# Patient Record
Sex: Male | Born: 1990 | Hispanic: Yes | Marital: Single | Smoking: Never smoker
Health system: Southern US, Community
[De-identification: ages and names within clinical notes are randomized; demographics above are authoritative.]

## PROBLEM LIST (undated history)

## (undated) DIAGNOSIS — J45909 Unspecified asthma, uncomplicated: Secondary | ICD-10-CM

---

## 2021-08-15 ENCOUNTER — Emergency Department (HOSPITAL_COMMUNITY): Payer: Self-pay

## 2021-08-15 ENCOUNTER — Emergency Department (HOSPITAL_COMMUNITY)
Admission: EM | Admit: 2021-08-15 | Discharge: 2021-08-15 | Disposition: A | Discharge status: Home / Self Care | Payer: Self-pay | Attending: Emergency Medicine | Admitting: Emergency Medicine

## 2021-08-15 ENCOUNTER — Encounter (HOSPITAL_COMMUNITY): Payer: Self-pay | Admitting: Emergency Medicine

## 2021-08-15 ENCOUNTER — Other Ambulatory Visit: Payer: Self-pay

## 2021-08-15 DIAGNOSIS — J039 Acute tonsillitis, unspecified: Secondary | ICD-10-CM | POA: Insufficient documentation

## 2021-08-15 DIAGNOSIS — J020 Streptococcal pharyngitis: Secondary | ICD-10-CM

## 2021-08-15 DIAGNOSIS — R000 Tachycardia, unspecified: Secondary | ICD-10-CM | POA: Insufficient documentation

## 2021-08-15 DIAGNOSIS — J45909 Unspecified asthma, uncomplicated: Secondary | ICD-10-CM | POA: Insufficient documentation

## 2021-08-15 DIAGNOSIS — D72829 Elevated white blood cell count, unspecified: Secondary | ICD-10-CM | POA: Insufficient documentation

## 2021-08-15 HISTORY — DX: Unspecified asthma, uncomplicated: J45.909

## 2021-08-15 LAB — CBC WITH DIFFERENTIAL/PLATELET
Abs Immature Granulocytes: 0.03 10*3/uL (ref 0.00–0.07)
Basophils Absolute: 0 10*3/uL (ref 0.0–0.1)
Basophils Relative: 0 %
Eosinophils Absolute: 0 10*3/uL (ref 0.0–0.5)
Eosinophils Relative: 0 %
HCT: 43.2 % (ref 39.0–52.0)
Hemoglobin: 15.2 g/dL (ref 13.0–17.0)
Immature Granulocytes: 0 %
Lymphocytes Relative: 21 %
Lymphs Abs: 2.3 10*3/uL (ref 0.7–4.0)
MCH: 31.2 pg (ref 26.0–34.0)
MCHC: 35.2 g/dL (ref 30.0–36.0)
MCV: 88.7 fL (ref 80.0–100.0)
Monocytes Absolute: 1.1 10*3/uL — ABNORMAL HIGH (ref 0.1–1.0)
Monocytes Relative: 10 %
Neutro Abs: 7.5 10*3/uL (ref 1.7–7.7)
Neutrophils Relative %: 69 %
Platelets: 242 10*3/uL (ref 150–400)
RBC: 4.87 MIL/uL (ref 4.22–5.81)
RDW: 12.4 % (ref 11.5–15.5)
WBC: 11 10*3/uL — ABNORMAL HIGH (ref 4.0–10.5)
nRBC: 0 % (ref 0.0–0.2)

## 2021-08-15 LAB — COMPREHENSIVE METABOLIC PANEL
ALT: 35 U/L (ref 0–44)
AST: 22 U/L (ref 15–41)
Albumin: 4.3 g/dL (ref 3.5–5.0)
Alkaline Phosphatase: 76 U/L (ref 38–126)
Anion gap: 8 (ref 5–15)
BUN: 10 mg/dL (ref 6–20)
CO2: 25 mmol/L (ref 22–32)
Calcium: 9.2 mg/dL (ref 8.9–10.3)
Chloride: 104 mmol/L (ref 98–111)
Creatinine, Ser: 0.7 mg/dL (ref 0.61–1.24)
GFR, Estimated: >60 mL/min (ref >60)
Glucose, Bld: 143 mg/dL — ABNORMAL HIGH (ref 70–99)
Potassium: 3.6 mmol/L (ref 3.5–5.1)
Sodium: 137 mmol/L (ref 135–145)
Total Bilirubin: 1.5 mg/dL — ABNORMAL HIGH (ref 0.3–1.2)
Total Protein: 7.5 g/dL (ref 6.5–8.1)

## 2021-08-15 LAB — GROUP A STREP BY PCR: Group A Strep by PCR: DETECTED — AB

## 2021-08-15 LAB — LACTIC ACID, PLASMA: Lactic Acid, Venous: 1 mmol/L (ref 0.5–1.9)

## 2021-08-15 MED ORDER — IOHEXOL 300 MG/ML  SOLN
50.0000 mL | Freq: Once | INTRAMUSCULAR | Status: AC | PRN
  Administered 2021-08-15: 50 mL via INTRAVENOUS

## 2021-08-15 MED ORDER — OXYCODONE-ACETAMINOPHEN 5-325 MG PO TABS: 1.0000 | ORAL_TABLET | ORAL | 0 refills | Status: AC | PRN

## 2021-08-15 MED ORDER — DEXAMETHASONE SODIUM PHOSPHATE 10 MG/ML IJ SOLN
10.0000 mg | Freq: Once | INTRAMUSCULAR | Status: AC
  Administered 2021-08-15: 10 mg via INTRAVENOUS
  Filled 2021-08-15: qty 1

## 2021-08-15 MED ORDER — MORPHINE SULFATE (PF) 4 MG/ML IV SOLN
4.0000 mg | Freq: Once | INTRAVENOUS | Status: AC
  Administered 2021-08-15: 4 mg via INTRAVENOUS
  Filled 2021-08-15: qty 1

## 2021-08-15 MED ORDER — CLINDAMYCIN HCL 150 MG PO CAPS: 450.0000 mg | ORAL_CAPSULE | Freq: Three times a day (TID) | ORAL | 0 refills | Status: AC

## 2021-08-15 NOTE — ED Notes (Signed)
Reviewed discharge instructions with patient. Follow-up care and medications reviewed. Patient  verbalized understanding. Patient A&Ox4, VSS, and ambulatory with steady gait upon discharge.  °

## 2021-08-15 NOTE — ED Notes (Signed)
Pt provided w/ [place] sandwich and gingerale for PO challenge per Tegeler MD

## 2021-08-15 NOTE — Discharge Instructions (Signed)
Your history, exam and work-up today revealed evidence of strep throat causing tonsillitis and the swelling and pain.  There is no evidence of abscess on the CT imaging.  As you were able to eat and drink and your vital signs were reassuring, we feel you are safe for discharge home.  Please the pain medicine help with discomfort and take the antibiotic for the next 2 weeks.  Please follow-up with the ENT if symptoms persist.  If any symptoms change or worsen acutely including having difficulty breathing, please return to the nearest emergency department.

## 2021-08-15 NOTE — ED Provider Notes (Signed)
[facility] EMERGENCY DEPARTMENT Provider Note   CSN: [id number] Arrival date & time: 08/15/21  4098     History  No chief complaint on file.   Peter Williamson is a 31 y.o. male.  The history is provided by the patient and medical records. No language interpreter was used.  Sore Throat This is a new problem. The current episode started 2 days ago. The problem occurs constantly. The problem has been gradually worsening. Pertinent negatives include no chest pain, no abdominal pain, no headaches and no shortness of breath (with certain positions has difficulty breathing). Nothing aggravates the symptoms. Nothing relieves the symptoms. He has tried nothing for the symptoms. The treatment provided no relief.       Home Medications Prior to Admission medications   Not on File      Allergies    Patient has no known allergies.    Review of Systems   Review of Systems  Constitutional:  Positive for chills and fever. Negative for diaphoresis and fatigue.  HENT:  Positive for sore throat and trouble swallowing. Negative for congestion and voice change.   Eyes:  Negative for visual disturbance.  Respiratory:  Negative for cough, chest tightness, shortness of breath (with certain positions has difficulty breathing) and wheezing.   Cardiovascular:  Negative for chest pain.  Gastrointestinal:  Negative for abdominal pain, constipation, diarrhea, nausea and vomiting.  Genitourinary:  Negative for dysuria and flank pain.  Musculoskeletal:  Negative for back pain, neck pain and neck stiffness.  Skin:  Negative for rash and wound.  Neurological:  Negative for weakness, light-headedness, numbness and headaches.  Psychiatric/Behavioral:  Negative for agitation and confusion.   All other systems reviewed and are negative.   Physical Exam Updated Vital Signs BP (!) 161/99 (BP Location: Right Arm)   Pulse (!) 109   Temp 100 F (37.8 C) (Oral)   Resp 18    SpO2 95%  Physical Exam Vitals and nursing note reviewed.  Constitutional:      General: He is not in acute distress.    Appearance: He is well-developed. He is not ill-appearing, toxic-appearing or diaphoretic.  HENT:     Head: Normocephalic and atraumatic.     Nose: No congestion or rhinorrhea.     Mouth/Throat:     Mouth: Mucous membranes are moist.     Pharynx: Oropharyngeal exudate and posterior oropharyngeal erythema present.     Tonsils: Tonsillar exudate present.     Comments: Uvula midline but there is some more prominence of the right tonsil compared to left.  There is some erythema and some subtle exudates.  Normal neck range of motion but pain with turning to the right.  No posterior neck tenderness.  No crepitance.  No stridor. Eyes:     Extraocular Movements: Extraocular movements intact.     Conjunctiva/sclera: Conjunctivae normal.     Pupils: Pupils are equal, round, and reactive to light.  Neck:     Vascular: No carotid bruit.   Cardiovascular:     Rate and Rhythm: Regular rhythm. Tachycardia present.     Pulses: Normal pulses.     Heart sounds: No murmur heard. Pulmonary:     Effort: Pulmonary effort is normal. No respiratory distress.     Breath sounds: Normal breath sounds. No wheezing, rhonchi or rales.  Chest:     Chest wall: No tenderness.  Abdominal:     General: Abdomen is flat.     Palpations: Abdomen is  soft.     Tenderness: There is no abdominal tenderness. There is no right CVA tenderness, left CVA tenderness, guarding or rebound.  Musculoskeletal:        General: Tenderness present. No swelling.     Cervical back: Neck supple. Tenderness present. No rigidity. No spinous process tenderness or muscular tenderness.     Right lower leg: No edema.     Left lower leg: No edema.  Skin:    General: Skin is warm and dry.     Capillary Refill: Capillary refill takes less than 2 seconds.     Findings: No rash.  Neurological:     General: No focal  deficit present.     Mental Status: He is alert.  Psychiatric:        Mood and Affect: Mood normal.     ED Results / Procedures / Treatments   Labs (all labs ordered are listed, but only abnormal results are displayed) Labs Reviewed  GROUP A STREP BY PCR - Abnormal; Notable for the following components:      Result Value   Group A Strep by PCR DETECTED (*)    All other components within normal limits  CBC WITH DIFFERENTIAL/PLATELET - Abnormal; Notable for the following components:   WBC 11.0 (*)    Monocytes Absolute 1.1 (*)    All other components within normal limits  COMPREHENSIVE METABOLIC PANEL - Abnormal; Notable for the following components:   Glucose, Bld 143 (*)    Total Bilirubin 1.5 (*)    All other components within normal limits  CULTURE, BLOOD (ROUTINE X 2)  CULTURE, BLOOD (ROUTINE X 2)  LACTIC ACID, PLASMA    EKG None  Radiology CT Soft Tissue Neck W Contrast  Result Date: 08/15/2021 CLINICAL DATA:  Epiglottitis or tonsillitis suspected Severe right-sided neck pain and sensation of swallowing. Strep positive. Rule out peritonsillar or tonsillar abscess. Patient worse difficulty breathing when he lays flat. EXAM: CT NECK WITH CONTRAST TECHNIQUE: Multidetector CT imaging of the neck was performed using the standard protocol following the bolus administration of intravenous contrast. RADIATION DOSE REDUCTION: This exam was performed according to the departmental dose-optimization program which includes automated exposure control, adjustment of the mA and/or kV according to patient size and/or use of iterative reconstruction technique. CONTRAST:  50mL OMNIPAQUE IOHEXOL 300 MG/ML  SOLN COMPARISON:  None Available. FINDINGS: Pharynx and larynx: Mild asymmetric edema and thickening of the right palatine tonsil. No discrete, drainable fluid collection. Salivary glands: No inflammation, mass, or stone. Thyroid: Normal. Lymph nodes: Borderline enlarged upper right cervical chain  lymph nodes. Vascular: Limited assessment due to non arterial timing. Limited intracranial: Negative. Visualized orbits: Negative. Mastoids and visualized paranasal sinuses: Clear. Skeleton: No acute or aggressive process. Upper chest: Visualized lung apices are clear. IMPRESSION: 1. Mild asymmetric edema and thickening of the right palatine tonsil, suspicious for tonsillitis given the clinical history. No evidence of abscess. 2. Borderline enlarged upper right cervical chain lymph nodes, nonspecific but potentially reactive given the above findings. Electronically Signed   By: Feliberto HartsFrederick S Jones M.D.   On: 08/15/2021 11:35    Procedures Procedures    Medications Ordered in ED Medications  dexamethasone (DECADRON) injection 10 mg (10 mg Intravenous Given 08/15/21 0910)  morphine (PF) 4 MG/ML injection 4 mg (4 mg Intravenous Given 08/15/21 0910)  iohexol (OMNIPAQUE) 300 MG/ML solution 50 mL (50 mLs Intravenous Contrast Given 08/15/21 1125)    ED Course/ Medical Decision Making/ A&P  Medical Decision Making Amount and/or Complexity of Data Reviewed Labs: ordered. Radiology: ordered.  Risk Prescription drug management.    Peter Williamson is a 31 y.o. male with a past medical history significant for asthma who presents with sore throat, fevers, chills, and positional difficulty breathing.  According to patient, he has had sore throat for the last 2 or 3 days.  He reports fevers and chills and was found to be near febrile with a temp of 100.0 orally on arrival.  He says that he has not had any congestion, cough, chest pain, or constant shortness of breath.  He has difficulty swallowing and reports he has not eaten food in the last 2 days.  He does report he had some fluids that he can swallow with pain.  He is still tolerating secretions but he says when he lays flat or tilts his head forward he has difficulty breathing.  He feels that there is swelling inside his  throat on the right side which is where he has the most pain.  It is moderate to severe pain.  He has never had this pain like this before but thinks he has had strep throat in the past.  Denies trauma.  Denies any rashes.  Denies any nausea, vomiting, constipation, diarrhea, or urinary changes.  Denies any significant headache or neck stiffness.  He does report pain when he moves his head twisting to the right.  On exam, lungs clear and chest nontender.  Abdomen nontender.  He is slightly tachypneic and tachycardic but was not completely febrile.  On exam he does have tenderness on his right submandibular and neck area.  No crepitance.  No skin changes.  He does have some erythema and some tonsillar exudates on exam and the right tonsil does appear more full than the left side.  Uvula however is midline.  No stridor appreciated.  Rest of exam unremarkable.  Clinically I am concerned about peritonsillar abscess given the patient's history, description, and exam.  Differential includes other concerning findings such as a deeper space neck infection, Ludewig's angina, tonsillitis, or retropharyngeal abscess however PTA is my biggest concern versus regular tonsillitis.  We will get CT imaging and labs and give him some pain medicine and some Decadron.  Anticipate reassessment after labs and CT scan to determine disposition.       12:41 PM CT scan returned showing evidence of tonsillitis with some swelling but no evidence of acute abscess.  This is consistent with the exam.  Labs did show mild leukocytosis but were otherwise reassuring.  Patient reports the Decadron and pain medicine has helped and he is now swallowing better.  We will have him passed a p.o. challenge and if he does so, we will give him prescription for antibiotics, pain medicine, and let him be discharged to follow-up with outpatient ENT if needed.  Anticipate discharge shortly.         Final Clinical Impression(s) / ED  Diagnoses Final diagnoses:  Tonsillitis  Acute streptococcal pharyngitis     Clinical Impression: 1. Tonsillitis   2. Acute streptococcal pharyngitis     Disposition: Discharge  Condition: Good  I have discussed the results, Dx and Tx plan with the pt(& family if present). He/she/they expressed understanding and agree(s) with the plan. Discharge instructions discussed at great length. Strict return precautions discussed and pt &/or family have verbalized understanding of the instructions. No further questions at time of discharge.    New Prescriptions   CLINDAMYCIN (  CLEOCIN) 150 MG CAPSULE    Take 3 capsules (450 mg total) by mouth 3 (three) times daily for 14 days.   OXYCODONE-ACETAMINOPHEN (PERCOCET/ROXICET) 5-325 MG TABLET    Take 1 tablet by mouth every 4 (four) hours as needed for severe pain.    Follow Up: Newman Pies, MD [address] [place] [address] [phone]     [facility] EMERGENCY DEPARTMENT [address] [id number] mc [place] [address] [phone]             Mikaila Grunert, Canary Brim, MD 08/15/21 1258

## 2021-08-15 NOTE — ED Triage Notes (Signed)
Pt states over the past few days he has felt sick, with slight fever. Then his throat became very sore. Pt states his right tonsil is very swollen. Pt states this morning he started feeling like he couldn't breathe when he was laying down. Pt states he can breathe easier when he sits up. Pt talking in complete sentences. No distress at this time.

## 2021-08-20 LAB — CULTURE, BLOOD (ROUTINE X 2)
Culture: NO GROWTH
Culture: NO GROWTH
Special Requests: ADEQUATE

## 2023-07-08 IMAGING — CT CT NECK W/ CM
3 of 4 series · 13 of 33 positions shown, 16 images · IV contrast (Omni 300)
Comparison: None Available.

CLINICAL DATA: Epiglottitis or tonsillitis suspected Severe
right-sided neck pain and sensation of swallowing. Strep positive.
Rule out peritonsillar or tonsillar abscess. Patient worse
difficulty breathing when he lays flat.

EXAM:
CT NECK WITH CONTRAST
TECHNIQUE: Multidetector CT imaging of the neck was performed using the
standard protocol following the bolus administration of intravenous
contrast.

[Series 3: neck 2.0 st · axial · 0.52mm/px · z∈[-254,-118]mm · 5 of 104 slices shown, 7 images]
[im 18/104  soft-tissue]
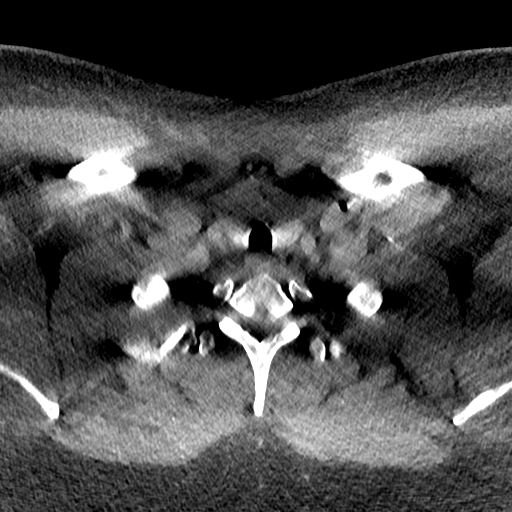
[im 18/104  bone]
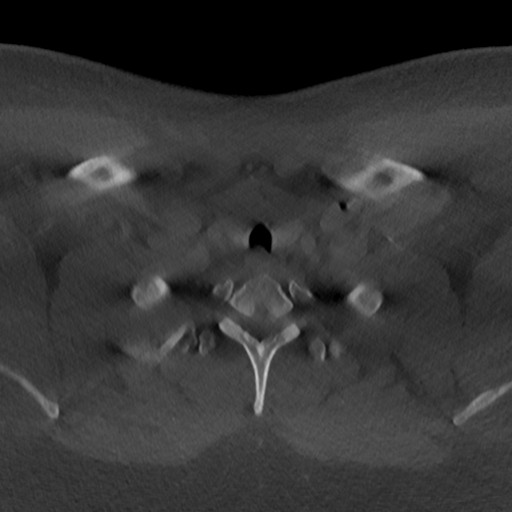
[im 35/104  bone]
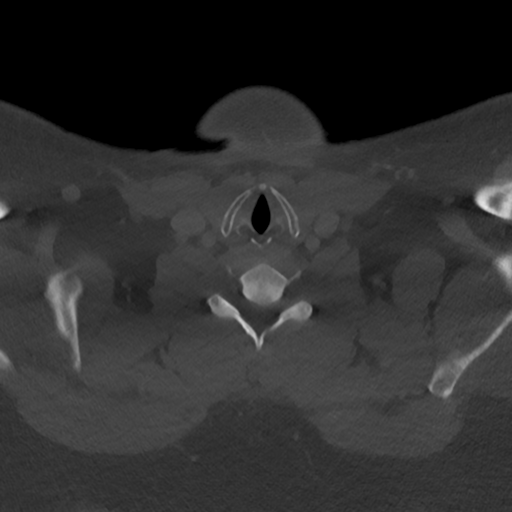
[im 52/104  bone]
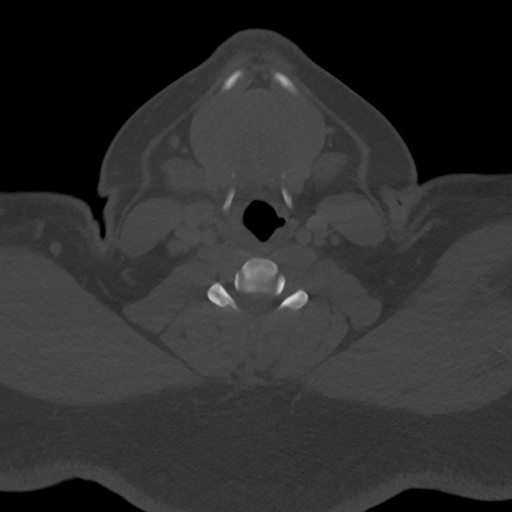
[im 69/104  bone]
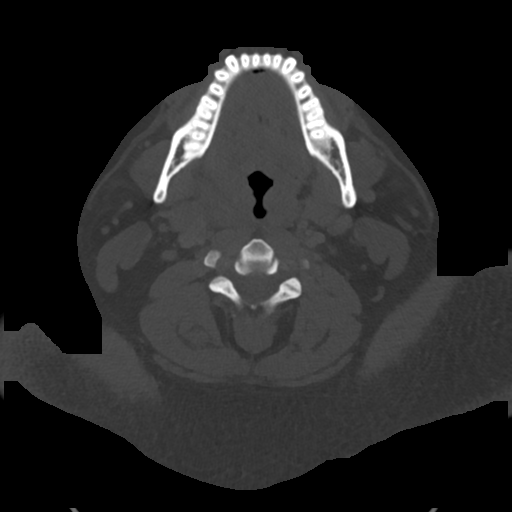
[im 86/104  soft-tissue]
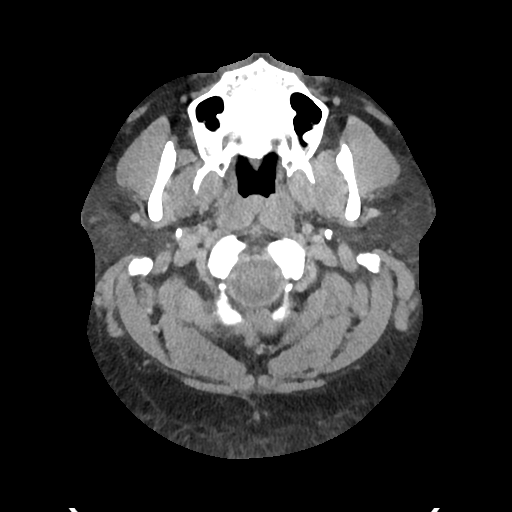
[im 86/104  bone]
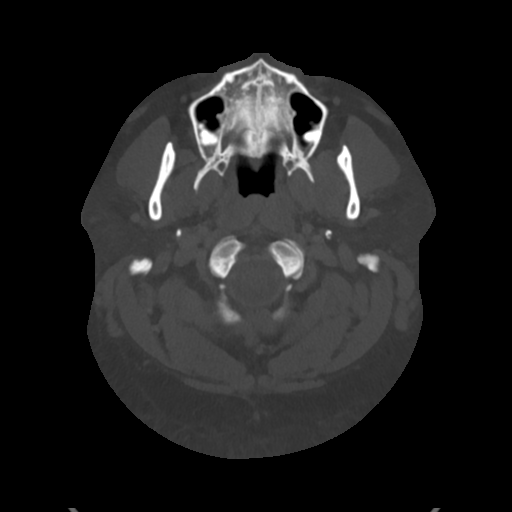

[Series 4: sagittal · sagittal · 0.40mm/px · 5 of 120 slices shown, 6 images]
[im 40/120  bone]
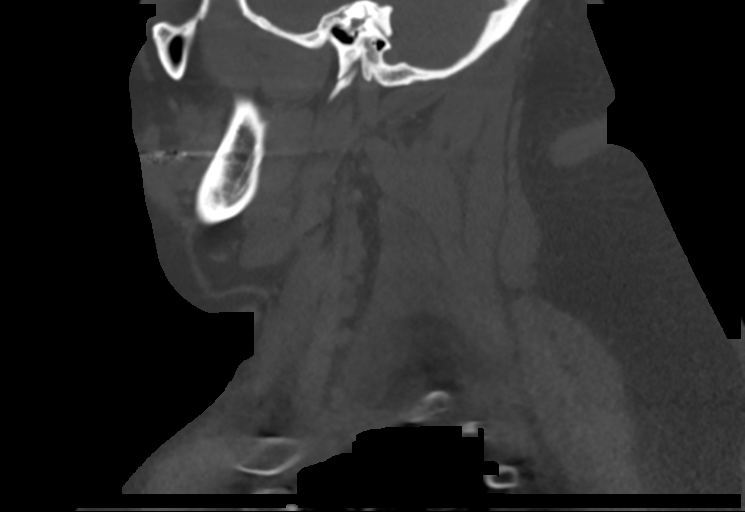
[im 50/120  bone]
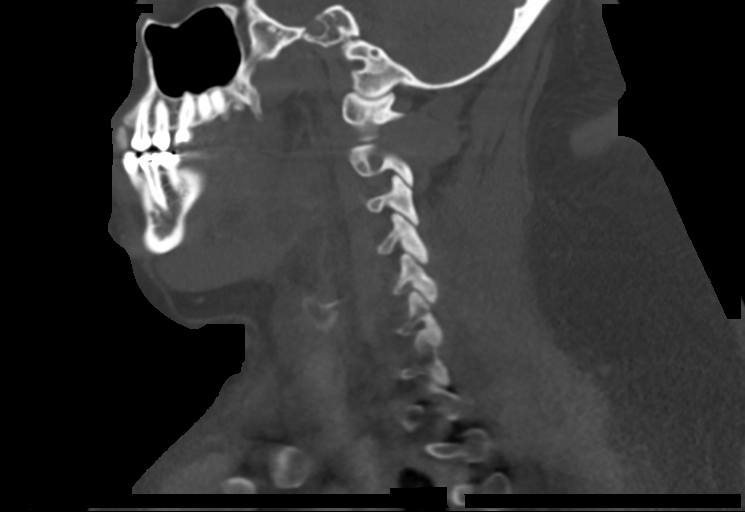
[im 60/120  soft-tissue]
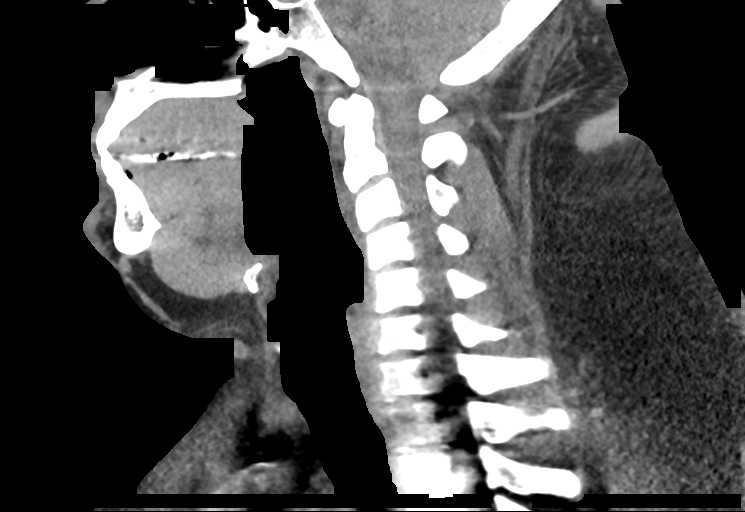
[im 60/120  bone]
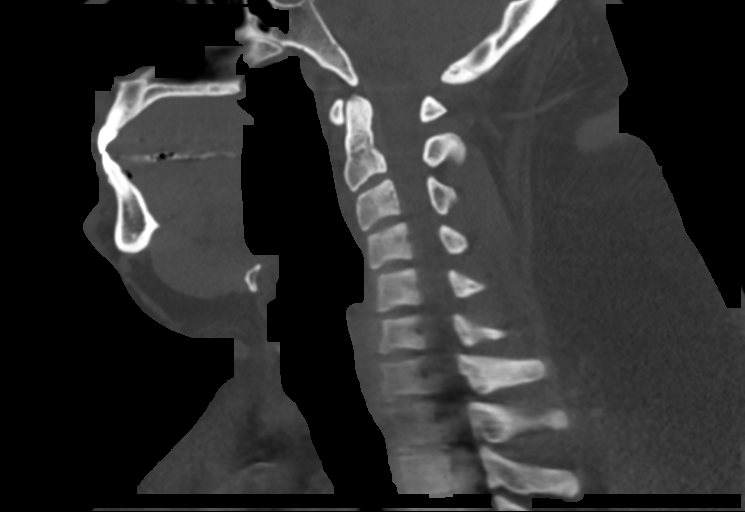
[im 70/120  bone]
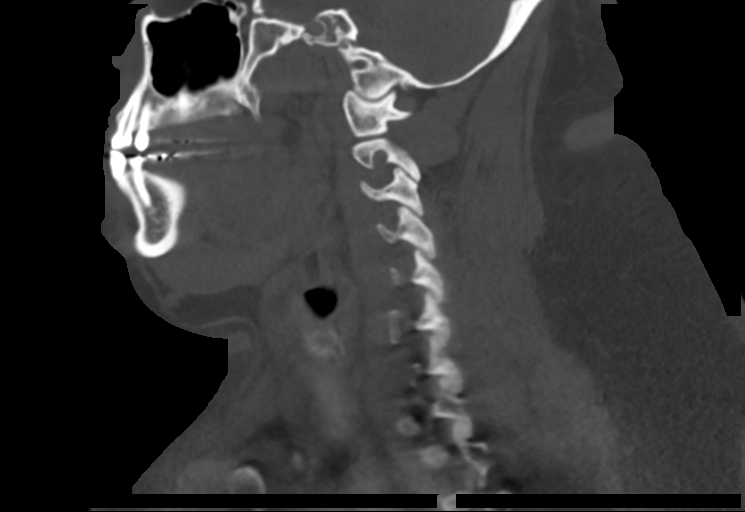
[im 80/120  bone]
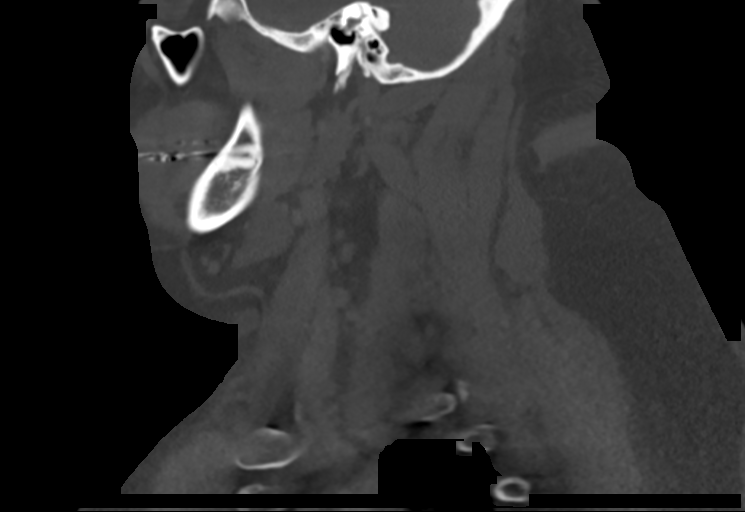

[Series 5: coronal · coronal · 0.41mm/px · 3 of 141 slices shown]
[im 29/141  bone]
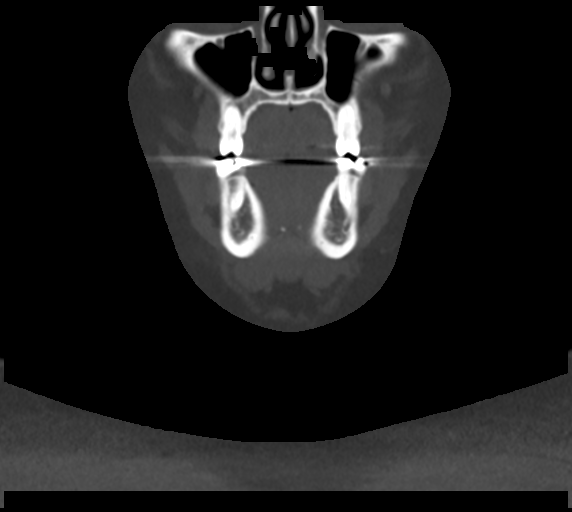
[im 57/141  bone]
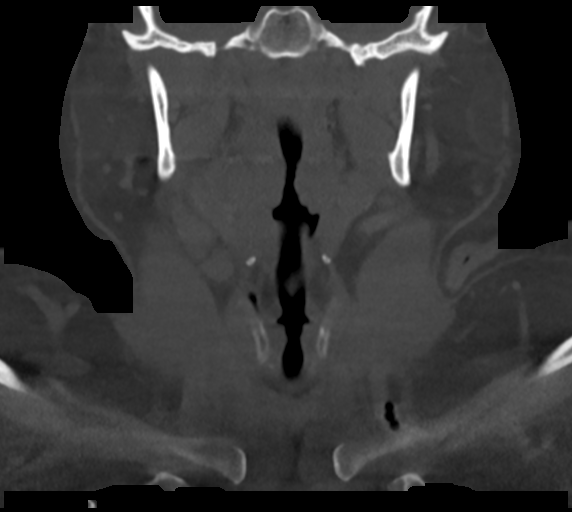
[im 85/141  bone]
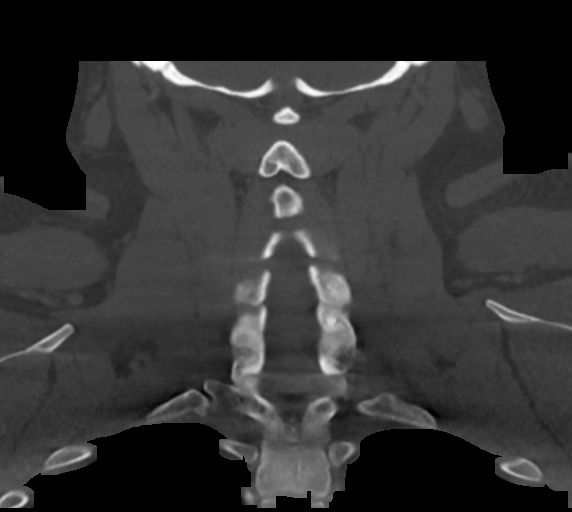

[13 of 33 positions shown; findings below may reference images not displayed]

RADIATION DOSE REDUCTION: This exam was performed according to the
departmental dose-optimization program which includes automated
exposure control, adjustment of the mA and/or kV according to
patient size and/or use of iterative reconstruction technique.

CONTRAST:  50mL OMNIPAQUE IOHEXOL 300 MG/ML  SOLN
FINDINGS: Pharynx and larynx: Mild asymmetric edema and thickening of the
right palatine tonsil. No discrete, drainable fluid collection.

Salivary glands: No inflammation, mass, or stone.

Thyroid: Normal.

Lymph nodes: Borderline enlarged upper right cervical chain lymph
nodes.

Vascular: Limited assessment due to non arterial timing.

Limited intracranial: Negative.

Visualized orbits: Negative.

Mastoids and visualized paranasal sinuses: Clear.

Skeleton: No acute or aggressive process.

Upper chest: Visualized lung apices are clear.
IMPRESSION: 1. Mild asymmetric edema and thickening of the right palatine
tonsil, suspicious for tonsillitis given the clinical history. No
evidence of abscess.
2. Borderline enlarged upper right cervical chain lymph nodes,
nonspecific but potentially reactive given the above findings.
